# Patient Record
Sex: Female | Born: 1960 | Race: White | Hispanic: No | Marital: Married | Smoking: Never smoker
Health system: Southern US, Community
[De-identification: ages and names within clinical notes are randomized; demographics above are authoritative.]

## PROBLEM LIST (undated history)

## (undated) DIAGNOSIS — E039 Hypothyroidism, unspecified: Secondary | ICD-10-CM

---

## 2008-11-02 ENCOUNTER — Other Ambulatory Visit: Admission: RE | Admit: 2008-11-02 | Discharge: 2008-11-02 | Payer: Self-pay | Admitting: Family Medicine

## 2009-05-12 ENCOUNTER — Encounter: Admission: RE | Admit: 2009-05-12 | Discharge: 2009-05-12 | Payer: Self-pay | Admitting: Family Medicine

## 2009-12-17 ENCOUNTER — Other Ambulatory Visit: Admission: RE | Admit: 2009-12-17 | Discharge: 2009-12-17 | Payer: Self-pay | Admitting: Family Medicine

## 2010-04-14 ENCOUNTER — Other Ambulatory Visit: Payer: Self-pay | Admitting: Family Medicine

## 2010-04-14 DIAGNOSIS — Z1231 Encounter for screening mammogram for malignant neoplasm of breast: Secondary | ICD-10-CM

## 2010-05-16 ENCOUNTER — Ambulatory Visit: Payer: Self-pay

## 2010-05-24 ENCOUNTER — Ambulatory Visit
Admission: RE | Admit: 2010-05-24 | Discharge: 2010-05-24 | Disposition: A | Discharge status: Home / Self Care | Payer: 59 | Source: Ambulatory Visit | Attending: Family Medicine | Admitting: Family Medicine

## 2010-05-24 DIAGNOSIS — Z1231 Encounter for screening mammogram for malignant neoplasm of breast: Secondary | ICD-10-CM

## 2011-04-21 ENCOUNTER — Other Ambulatory Visit: Payer: Self-pay | Admitting: Family Medicine

## 2011-04-21 DIAGNOSIS — Z1231 Encounter for screening mammogram for malignant neoplasm of breast: Secondary | ICD-10-CM

## 2011-05-30 ENCOUNTER — Ambulatory Visit
Admission: RE | Admit: 2011-05-30 | Discharge: 2011-05-30 | Disposition: A | Discharge status: Home / Self Care | Payer: 59 | Source: Ambulatory Visit | Attending: Family Medicine | Admitting: Family Medicine

## 2011-05-30 DIAGNOSIS — Z1231 Encounter for screening mammogram for malignant neoplasm of breast: Secondary | ICD-10-CM

## 2012-04-29 ENCOUNTER — Other Ambulatory Visit: Payer: Self-pay | Admitting: Family Medicine

## 2012-04-29 DIAGNOSIS — Z1231 Encounter for screening mammogram for malignant neoplasm of breast: Secondary | ICD-10-CM

## 2012-05-30 ENCOUNTER — Ambulatory Visit
Admission: RE | Admit: 2012-05-30 | Discharge: 2012-05-30 | Disposition: A | Discharge status: Home / Self Care | Payer: 59 | Source: Ambulatory Visit | Attending: Family Medicine | Admitting: Family Medicine

## 2012-05-30 DIAGNOSIS — Z1231 Encounter for screening mammogram for malignant neoplasm of breast: Secondary | ICD-10-CM

## 2013-05-19 ENCOUNTER — Other Ambulatory Visit: Payer: Self-pay

## 2013-05-19 DIAGNOSIS — Z1231 Encounter for screening mammogram for malignant neoplasm of breast: Secondary | ICD-10-CM

## 2013-06-04 ENCOUNTER — Ambulatory Visit
Admission: RE | Admit: 2013-06-04 | Discharge: 2013-06-04 | Disposition: A | Discharge status: Home / Self Care | Payer: 59 | Source: Ambulatory Visit

## 2013-06-04 DIAGNOSIS — Z1231 Encounter for screening mammogram for malignant neoplasm of breast: Secondary | ICD-10-CM

## 2020-08-31 ENCOUNTER — Emergency Department (HOSPITAL_BASED_OUTPATIENT_CLINIC_OR_DEPARTMENT_OTHER): Payer: BLUE CROSS/BLUE SHIELD

## 2020-08-31 ENCOUNTER — Emergency Department: Payer: BLUE CROSS/BLUE SHIELD

## 2020-08-31 ENCOUNTER — Emergency Department (HOSPITAL_BASED_OUTPATIENT_CLINIC_OR_DEPARTMENT_OTHER)
Admission: EM | Admit: 2020-08-31 | Discharge: 2020-09-01 | Disposition: A | Discharge status: Home / Self Care | Payer: BLUE CROSS/BLUE SHIELD | Attending: Emergency Medicine | Admitting: Emergency Medicine

## 2020-08-31 ENCOUNTER — Encounter (HOSPITAL_BASED_OUTPATIENT_CLINIC_OR_DEPARTMENT_OTHER): Payer: Self-pay

## 2020-08-31 ENCOUNTER — Other Ambulatory Visit: Payer: Self-pay

## 2020-08-31 DIAGNOSIS — S3991XA Unspecified injury of abdomen, initial encounter: Secondary | ICD-10-CM | POA: Diagnosis not present

## 2020-08-31 DIAGNOSIS — R0789 Other chest pain: Secondary | ICD-10-CM | POA: Diagnosis not present

## 2020-08-31 DIAGNOSIS — S22050A Wedge compression fracture of T5-T6 vertebra, initial encounter for closed fracture: Secondary | ICD-10-CM | POA: Diagnosis not present

## 2020-08-31 DIAGNOSIS — E039 Hypothyroidism, unspecified: Secondary | ICD-10-CM | POA: Diagnosis not present

## 2020-08-31 DIAGNOSIS — W19XXXA Unspecified fall, initial encounter: Secondary | ICD-10-CM

## 2020-08-31 DIAGNOSIS — W11XXXA Fall on and from ladder, initial encounter: Secondary | ICD-10-CM | POA: Diagnosis not present

## 2020-08-31 DIAGNOSIS — S299XXA Unspecified injury of thorax, initial encounter: Secondary | ICD-10-CM | POA: Diagnosis present

## 2020-08-31 HISTORY — DX: Hypothyroidism, unspecified: E03.9

## 2020-08-31 LAB — CBC WITH DIFFERENTIAL/PLATELET
Abs Immature Granulocytes: 0.07 10*3/uL (ref 0.00–0.07)
Basophils Absolute: 0 10*3/uL (ref 0.0–0.1)
Basophils Relative: 1 %
Eosinophils Absolute: 0.1 10*3/uL (ref 0.0–0.5)
Eosinophils Relative: 2 %
HCT: 41.2 % (ref 36.0–46.0)
Hemoglobin: 13.7 g/dL (ref 12.0–15.0)
Immature Granulocytes: 1 %
Lymphocytes Relative: 31 %
Lymphs Abs: 2.5 10*3/uL (ref 0.7–4.0)
MCH: 30.7 pg (ref 26.0–34.0)
MCHC: 33.3 g/dL (ref 30.0–36.0)
MCV: 92.4 fL (ref 80.0–100.0)
Monocytes Absolute: 0.6 10*3/uL (ref 0.1–1.0)
Monocytes Relative: 7 %
Neutro Abs: 4.8 10*3/uL (ref 1.7–7.7)
Neutrophils Relative %: 58 %
Platelets: 209 10*3/uL (ref 150–400)
RBC: 4.46 MIL/uL (ref 3.87–5.11)
RDW: 14.1 % (ref 11.5–15.5)
WBC: 8.1 10*3/uL (ref 4.0–10.5)
nRBC: 0 % (ref 0.0–0.2)

## 2020-08-31 LAB — COMPREHENSIVE METABOLIC PANEL
ALT: 18 U/L (ref 0–44)
AST: 24 U/L (ref 15–41)
Albumin: 4.3 g/dL (ref 3.5–5.0)
Alkaline Phosphatase: 73 U/L (ref 38–126)
Anion gap: 9 (ref 5–15)
BUN: 16 mg/dL (ref 6–20)
CO2: 29 mmol/L (ref 22–32)
Calcium: 9 mg/dL (ref 8.9–10.3)
Chloride: 102 mmol/L (ref 98–111)
Creatinine, Ser: 0.78 mg/dL (ref 0.44–1.00)
GFR, Estimated: >60 mL/min (ref >60)
Glucose, Bld: 111 mg/dL — ABNORMAL HIGH (ref 70–99)
Potassium: 3.5 mmol/L (ref 3.5–5.1)
Sodium: 140 mmol/L (ref 135–145)
Total Bilirubin: 0.5 mg/dL (ref 0.3–1.2)
Total Protein: 7.3 g/dL (ref 6.5–8.1)

## 2020-08-31 MED ORDER — FENTANYL CITRATE (PF) 100 MCG/2ML IJ SOLN
50.0000 ug | INTRAMUSCULAR | Status: DC | PRN
  Administered 2020-08-31: 50 ug via INTRAVENOUS
  Filled 2020-08-31: qty 2

## 2020-08-31 MED ORDER — IOHEXOL 300 MG/ML  SOLN
100.0000 mL | Freq: Once | INTRAMUSCULAR | Status: AC | PRN
  Administered 2020-08-31: 100 mL via INTRAVENOUS

## 2020-08-31 MED ORDER — FENTANYL CITRATE (PF) 100 MCG/2ML IJ SOLN
50.0000 ug | Freq: Once | INTRAMUSCULAR | Status: AC
  Administered 2020-08-31: 50 ug via INTRAVENOUS
  Filled 2020-08-31: qty 2

## 2020-08-31 MED ORDER — ONDANSETRON HCL 4 MG/2ML IJ SOLN
4.0000 mg | Freq: Once | INTRAMUSCULAR | Status: AC
  Administered 2020-08-31: 4 mg via INTRAVENOUS
  Filled 2020-08-31: qty 2

## 2020-08-31 NOTE — ED Notes (Signed)
Pt ambulatory to restroom. Declines w/c

## 2020-08-31 NOTE — ED Notes (Signed)
[facility] notified for TLSO brace; answering service to page rep for call back.

## 2020-08-31 NOTE — ED Notes (Signed)
Pt declines pain meds at this time stating she is feeling more comfortable and prefers to wait

## 2020-08-31 NOTE — ED Notes (Signed)
Pt returned from CT.  States she still wold like to "hold off" on pain med administration at this time

## 2020-08-31 NOTE — ED Provider Notes (Signed)
[facility] EMERGENCY DEPARTMENT Provider Note   CSN: [id number] Arrival date & time: 08/31/20  2013     History Chief Complaint  Patient presents with   Meagan Moreno is a 60 y.o. female.  Meagan Moreno is a 60 y.o. female with a history of hypothyroidism, otherwise healthy, who presents to the ED for evaluation after fall.  She reports she was up about 4 rungs on a ladder and wearing a backpack sprayer when the ladder started to fall and she fell backwards landing on her back on the backpack sprayer.  Since the fall she has had severe pain in her mid to low back.  Pain worse with any movement or taking a deep breath.  No head injury and no neck pain.  She denies any numbness weakness or tingling just severe pain in the back.  No anterior chest pain or abdominal pain.  No loss of bowel or bladder control or saddle anesthesia.  No pain medication prior to arrival, did receive a dose of medication in triage with some improvement.  The history is provided by the patient and the spouse.      Past Medical History:  Diagnosis Date   Hypothyroid     There are no problems to display for this patient.   History reviewed. No pertinent surgical history.   OB History   No obstetric history on file.     No family history on file.  Social History   Tobacco Use   Smoking status: Never   Smokeless tobacco: Never  Vaping Use   Vaping Use: Never used  Substance Use Topics   Alcohol use: Yes   Drug use: Never    Home Medications Prior to Admission medications   Medication Sig Start Date End Date Taking? Authorizing Provider  HYDROcodone-acetaminophen (NORCO) 5-325 MG tablet Take 1 tablet by mouth every 6 (six) hours as needed for severe pain. 09/01/20  Yes Dartha Lodge, PA-C    Allergies    Amoxicillin-pot clavulanate  Review of Systems   Review of Systems  Constitutional:  Negative for chills and fever.  HENT: Negative.    Respiratory:  Negative  for cough and shortness of breath.   Cardiovascular:  Negative for chest pain.  Gastrointestinal:  Negative for abdominal pain.  Musculoskeletal:  Positive for back pain. Negative for neck pain.  Skin:  Negative for color change, rash and wound.  Neurological:  Negative for weakness, numbness and headaches.  All other systems reviewed and are negative.  Physical Exam Updated Vital Signs BP (!) 148/90 (BP Location: Right Arm)   Pulse 72   Temp 98.3 F (36.8 C) (Oral)   Resp (!) 28   Ht 5\' 8"  (1.727 m)   Wt 64.4 kg   SpO2 96%   BMI 21.59 kg/m   Physical Exam Vitals and nursing note reviewed.  Constitutional:      General: She is not in acute distress.    Appearance: Normal appearance. She is well-developed. She is not diaphoretic.     Comments: Patient appears very uncomfortable but is in no acute distress  HENT:     Head: Normocephalic and atraumatic.     Comments: No hematoma, step-off or deformity, negative battle sign Eyes:     General:        Right eye: No discharge.        Left eye: No discharge.     Pupils: Pupils are equal, round, and reactive  to light.  Neck:     Comments: No midline C-spine tenderness, normal range of motion Cardiovascular:     Rate and Rhythm: Normal rate and regular rhythm.     Pulses: Normal pulses.     Heart sounds: Normal heart sounds.  Pulmonary:     Effort: Pulmonary effort is normal. No respiratory distress.     Breath sounds: Normal breath sounds. No wheezing or rales.     Comments: Respirations equal and unlabored, patient able to speak in full sentences, lungs clear to auscultation bilaterally, there is no tenderness over the anterior chest wall but patient with tenderness over posterior ribs bilaterally without palpable crepitus or deformity, no ecchymosis Chest:     Chest wall: Tenderness present.  Abdominal:     General: Bowel sounds are normal. There is no distension.     Palpations: Abdomen is soft. There is no mass.      Tenderness: There is no abdominal tenderness. There is no guarding.     Comments: Abdomen soft, nondistended, nontender to palpation in all quadrants without guarding or peritoneal signs there is some flank tenderness bilaterally  Musculoskeletal:        General: Tenderness present.     Cervical back: Neck supple.     Comments: Tenderness starting in the mid thoracic spine and extending down through the lumbar spine at midline with some overlying erythema and swelling, no ecchymosis and no palpable step-off.  Patient also has tenderness to the paraspinal muscles bilaterally. No focal pain over her extremities, or joints supple and easily movable, all compartments soft.  Skin:    General: Skin is warm and dry.     Capillary Refill: Capillary refill takes less than 2 seconds.  Neurological:     Mental Status: She is alert and oriented to person, place, and time.     Coordination: Coordination normal.     Comments: Speech is clear, able to follow commands CN III-XII intact Normal strength in upper and lower extremities bilaterally including dorsiflexion and plantar flexion, strong and equal grip strength Sensation normal to light and sharp touch Moves extremities without ataxia, coordination intact  Psychiatric:        Mood and Affect: Mood normal.        Behavior: Behavior normal.    ED Results / Procedures / Treatments   Labs (all labs ordered are listed, but only abnormal results are displayed) Labs Reviewed  COMPREHENSIVE METABOLIC PANEL - Abnormal; Notable for the following components:      Result Value   Glucose, Bld 111 (*)    All other components within normal limits  CBC WITH DIFFERENTIAL/PLATELET    EKG None  Radiology CT CHEST ABDOMEN PELVIS W CONTRAST  Result Date: 08/31/2020 CLINICAL DATA:  Fall from height, chest and abdominal trauma, back pain EXAM: CT CHEST, ABDOMEN, AND PELVIS WITH CONTRAST TECHNIQUE: Multidetector CT imaging of the chest, abdomen and pelvis was  performed following the standard protocol during bolus administration of intravenous contrast. CONTRAST:  100mL OMNIPAQUE IOHEXOL 300 MG/ML  SOLN COMPARISON:  None. FINDINGS: CT CHEST FINDINGS Cardiovascular: No significant vascular findings. Normal heart size. No pericardial effusion. Mediastinum/Nodes: No enlarged mediastinal, hilar, or axillary lymph nodes. Thyroid gland, trachea, and esophagus demonstrate no significant findings. Lungs/Pleura: Lungs are clear. No pleural effusion or pneumothorax. Musculoskeletal: There is a anterior wedge compression fracture of T6 with fracture of the superior endplate and approximately 10-20% loss of height. No retropulsion. No listhesis. Negligible surrounding paraspinal soft tissue swelling. CT  ABDOMEN PELVIS FINDINGS Hepatobiliary: Scattered cysts within the left hepatic lobe. Liver otherwise unremarkable. Gallbladder unremarkable. Pancreas: Unremarkable Spleen: Unremarkable Adrenals/Urinary Tract: Adrenal glands are unremarkable. Kidneys are normal, without renal calculi, focal lesion, or hydronephrosis. Bladder is unremarkable. Stomach/Bowel: Stomach is within normal limits. Appendix appears normal. No evidence of bowel wall thickening, distention, or inflammatory changes. No free intraperitoneal gas or fluid. Vascular/Lymphatic: Mild aortoiliac atherosclerotic calcification. No aortic aneurysm. No pathologic adenopathy within the abdomen and pelvis. Reproductive: Lobular contour of the uterus may relate to underlying uterine fibroids. The pelvic organs are otherwise unremarkable. Other: No abdominal wall hernia. Musculoskeletal: Degenerative changes are seen within the lumbar spine. No acute bone abnormality within the abdomen and pelvis. IMPRESSION: Acute mild anterior wedge compression fracture of T6 with fracture of the superior endplate and resultant 10-20% loss of height. No retropulsion or listhesis. No significant surrounding soft tissue swelling. Fibroid uterus.  Aortic Atherosclerosis (ICD10-I70.0). Electronically Signed   By: Helyn Numbers MD   On: 08/31/2020 22:31   CT T-SPINE NO CHARGE  Result Date: 09/01/2020 CLINICAL DATA:  Fall from ladder EXAM: CT Thoracic and Lumbar spine without contrast TECHNIQUE: Multiplanar CT images of the thoracic and lumbar spine were reconstructed from contemporary CT of the Chest, Abdomen, and Pelvis CONTRAST:  No additional contrast COMPARISON:  None FINDINGS: CT THORACIC SPINE FINDINGS Alignment: Normal. Vertebrae: There is a compression fracture of T6 with less than 25% height loss. Fracture involves the superior endplate and anterior wall. Paraspinal and other soft tissues: Negative. Disc levels: No spinal canal stenosis. 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 Alignment: Grade 1 anterolisthesis at L3-4. Vertebrae: No acute fracture. Paraspinal and other soft tissues: Negative. Disc levels: Mild left L4 foraminal stenosis. IMPRESSION: 1. T6 wedge compression fracture with less than 25% height loss. No retropulsion or spinal canal stenosis. 2. No acute fracture of the lumbar spine. 3. 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 Electronically Signed   By: Deatra Robinson M.D.   On: 09/01/2020 00:33   CT L-SPINE NO CHARGE  Result Date: 09/01/2020 CLINICAL DATA:  Fall from ladder EXAM: CT Thoracic and Lumbar spine without contrast TECHNIQUE: Multiplanar CT images of the thoracic and lumbar spine were reconstructed from contemporary CT of the Chest, Abdomen, and Pelvis CONTRAST:  No additional contrast COMPARISON:  None FINDINGS: CT THORACIC SPINE FINDINGS Alignment: Normal. Vertebrae: There is a compression fracture of T6 with less than 25% height loss. Fracture involves the superior endplate and anterior wall. Paraspinal and other soft tissues: Negative. Disc levels: No spinal canal stenosis. CT LUMBAR SPINE FINDINGS Segmentation: Transitional  lumbosacral anatomy with partial sacralization of L5. Alignment: Grade 1 anterolisthesis at L3-4. Vertebrae: No acute fracture. Paraspinal and other soft tissues: Negative. Disc levels: Mild left L4 foraminal stenosis. IMPRESSION: 1. T6 wedge compression fracture with less than 25% height loss. No retropulsion or spinal canal stenosis. 2. No acute fracture of the lumbar spine. 3. 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 Electronically Signed   By: Deatra Robinson M.D.   On: 09/01/2020 00:33     Procedures Procedures   Medications Ordered in ED Medications  ondansetron [facility]) injection 4 mg (4 mg Intravenous Given 08/31/20 2035)  fentaNYL (SUBLIMAZE) injection 50 mcg (50 mcg Intravenous Given 08/31/20 2231)  iohexol (OMNIPAQUE) 300 MG/ML solution 100 mL (100 mLs Intravenous Contrast Given 08/31/20 2203)  HYDROcodone-acetaminophen (NORCO/VICODIN) 5-325 MG per tablet 1 tablet (1 tablet Oral Given 09/01/20 0045)  ED Course  I have reviewed the triage vital signs and the nursing notes.  Pertinent labs & imaging results that were available during my care of the patient were reviewed by me and considered in my medical decision making (see chart for details).   MDM Rules/Calculators/A&P                         60 year old female who presents after fall from.  Complaining of severe back pain, very uncomfortable on arrival, but neurologically intact, no head or neck injury.  She has midline thoracic and lumbar spine tenderness, but also has tenderness over the posterior ribs and flanks.  We will get labs and CT chest abdomen pelvis with recons of the thoracic and lumbar spine to evaluate for potential vertebral fracture, rib fracture or retroperitoneal bleed or injury.  Additional pain medication given.  I have independently ordered, reviewed and interpreted all labs and imaging: CBC, no leukocytosis, normal hemoglobin CMP: Glucose 111, no other electrolyte derangements,  normal renal liver function  CT chest abdomen pelvis with acute mild anterior wedge compression fracture of T6 with less than 25% height loss, spinal recon images showed no evidence of spinal stenosis or retropulsion associated with this fracture.  No other vertebral fractures noted and fortunately no rib fractures, retroperitoneal injury or other acute findings noted.  Discussed with NP Hildred Priest with neurosurgery agrees with plan for TLSO and outpatient follow-up.  TLSO rep contacted to put patient in brace for pain control, especially given patient's plans for travel and 9-hour car ride in the next few days.  After after receiving brace patient reports significant improvement in pain.  Discussed appropriate pain management at home and prescribed short course of Norco as needed for severe pain.  We will have patient follow-up as an outpatient with neurosurgery.  Return precautions discussed.  Discharged home in good condition.  Final Clinical Impression(s) / ED Diagnoses Final diagnoses:  Fall  Closed wedge compression fracture of T6 vertebra, initial encounter [facility])    Rx / DC Orders ED Discharge Orders          Ordered    HYDROcodone-acetaminophen (NORCO) 5-325 MG tablet  Every 6 hours PRN        09/01/20 0021             Dartha Lodge, PA-C 09/06/20 1131    Tegeler, Canary Brim, MD 09/10/20 1104

## 2020-08-31 NOTE — ED Triage Notes (Signed)
Pt states she fell about 4 rungs off a ladder with a backpack sprayer on. Pt states she landed on her back and now had severe middle back pain. Pt has been ambulatory since fall. Distal CMS intact during triage.

## 2020-09-01 MED ORDER — HYDROCODONE-ACETAMINOPHEN 5-325 MG PO TABS
1.0000 | ORAL_TABLET | Freq: Once | ORAL | Status: AC
  Administered 2020-09-01: 1 via ORAL
  Filled 2020-09-01: qty 1

## 2020-09-01 MED ORDER — HYDROCODONE-ACETAMINOPHEN 5-325 MG PO TABS: 1.0000 | ORAL_TABLET | Freq: Four times a day (QID) | ORAL | 0 refills | Status: AC | PRN

## 2020-09-01 NOTE — ED Notes (Signed)
[facility] ED Ortho Tech here to place TLSO

## 2020-09-01 NOTE — ED Notes (Signed)
Repeat call to [facility] to follow up on TLSO brace. Message given to call service to notify rep.

## 2020-09-01 NOTE — Discharge Instructions (Addendum)
For pain use ibuprofen 600 mg and Tylenol 650 mg every 6 hours.  You can use prescribed hydrocodone as needed every 6 hours for severe breakthrough pain.  This medication can cause some drowsiness, use with caution as this can be addicting.  You can also use topical lidocaine patches, ice and heat to help with pain.  Wear TLSO brace when out of bed, can remove to shower.  You can follow-up with Dr. Dutch Quint with [place] [facility] or contact your PCP for a [facility] neurosurgery and spine referral.  Return for any new or worsening symptoms such as significantly worsened pain, numbness, weakness, loss of bowel or bladder control.

## 2022-02-12 IMAGING — CT CT L SPINE W/O CM
3 of 4 series · 14 of 33 positions shown, 16 images · IV contrast (Omnipaque)
Comparison: None

CLINICAL DATA: Fall from ladder

EXAM:
CT Thoracic and Lumbar spine without contrast
TECHNIQUE: Multiplanar CT images of the thoracic and lumbar spine were
reconstructed from contemporary CT of the Chest, Abdomen, and Pelvis
CONTRAST:  No additional contrast

[Series 11: l spine st · axial · 0.31mm/px · z∈[+864,+1040]mm · 6 of 115 slices shown, 8 images]
[im 18/115  soft-tissue]
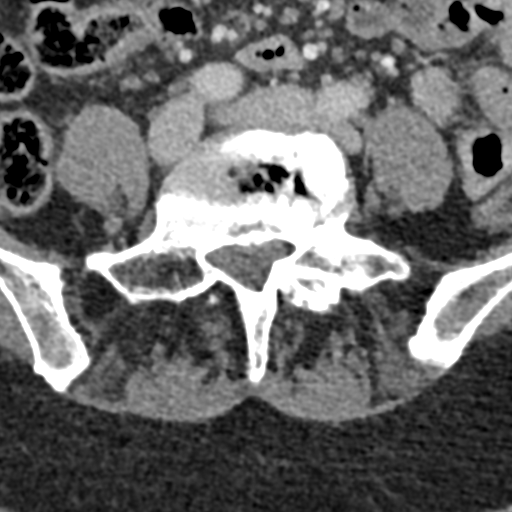
[im 18/115  bone]
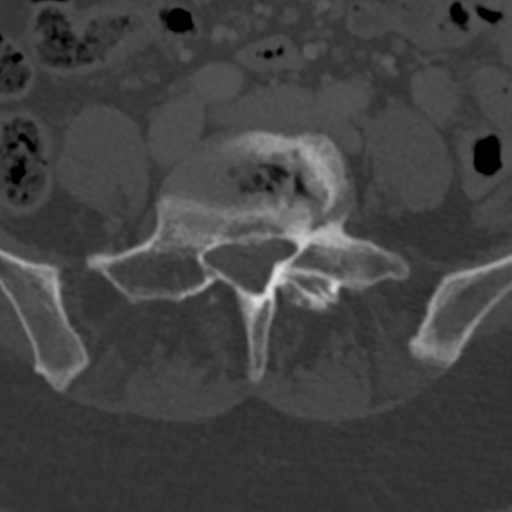
[im 36/115  bone]
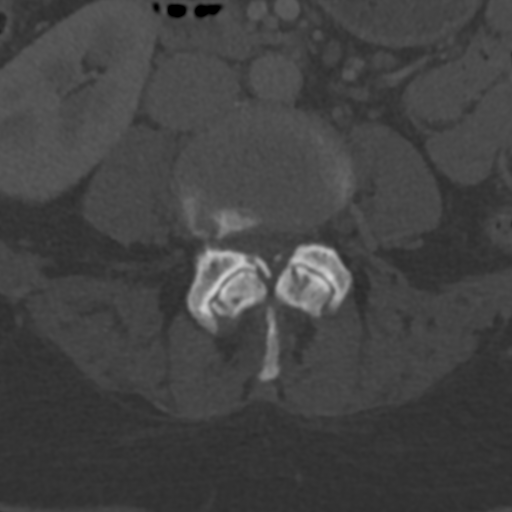
[im 53/115  bone]
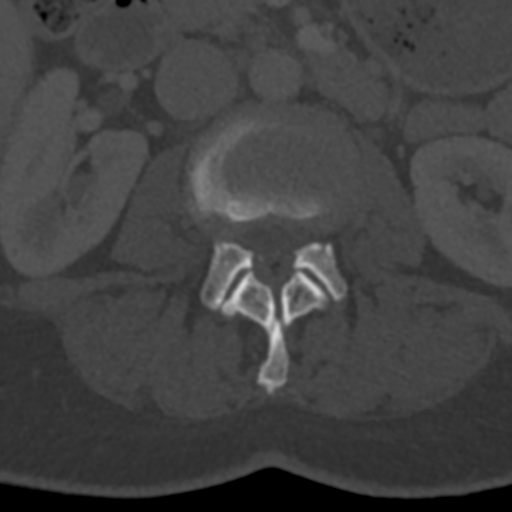
[im 71/115  bone]
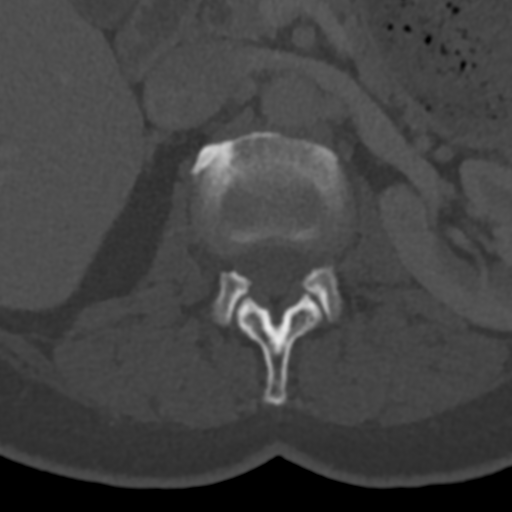
[im 88/115  soft-tissue]
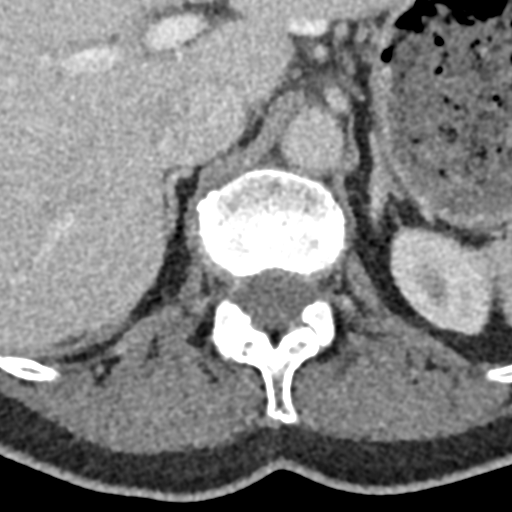
[im 88/115  bone]
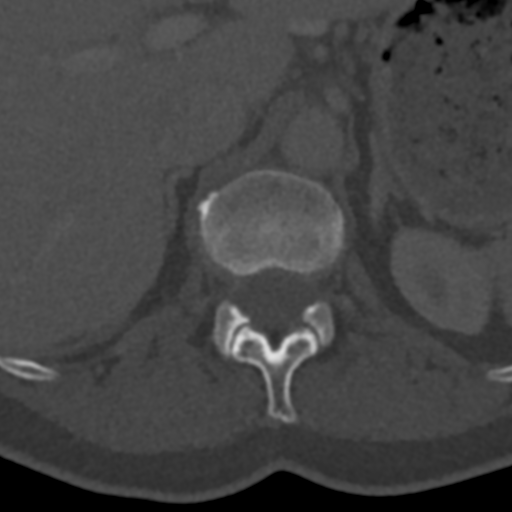
[im 106/115  bone]
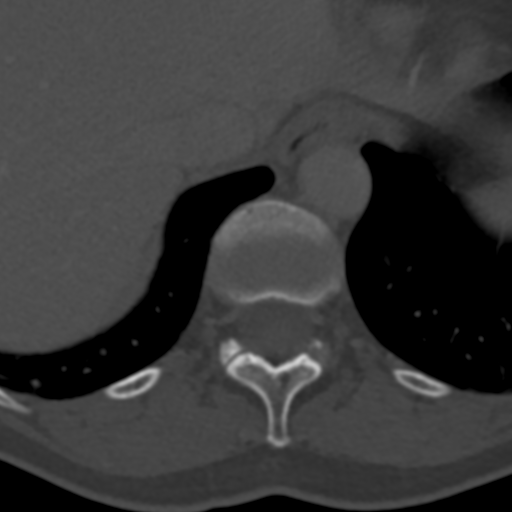

[Series 12: l spine cor bone · coronal · 0.36mm/px · 3 of 82 slices shown]
[im 17/82  bone]
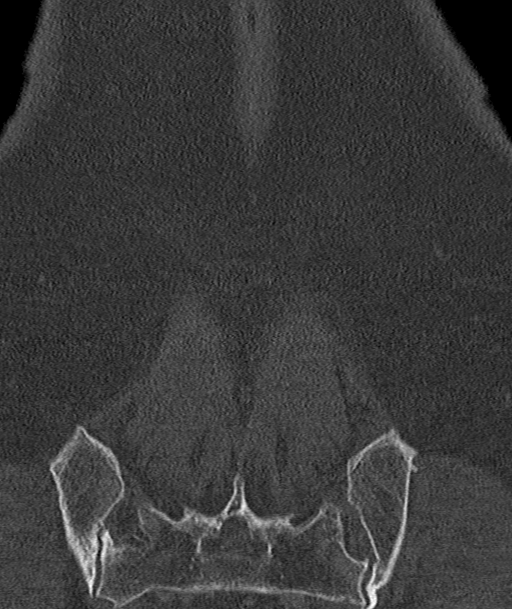
[im 33/82  bone]
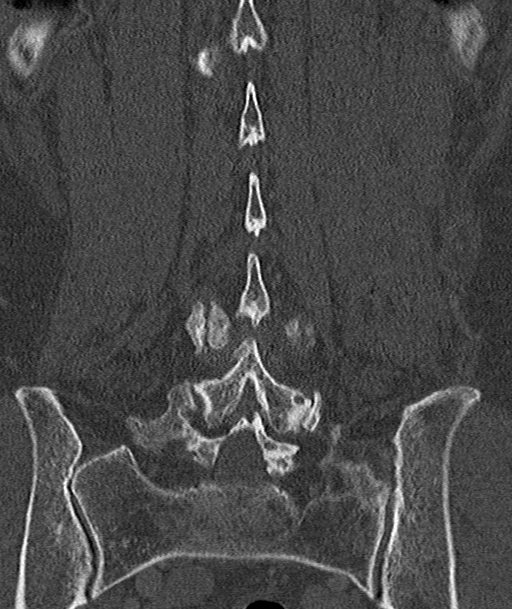
[im 49/82  bone]
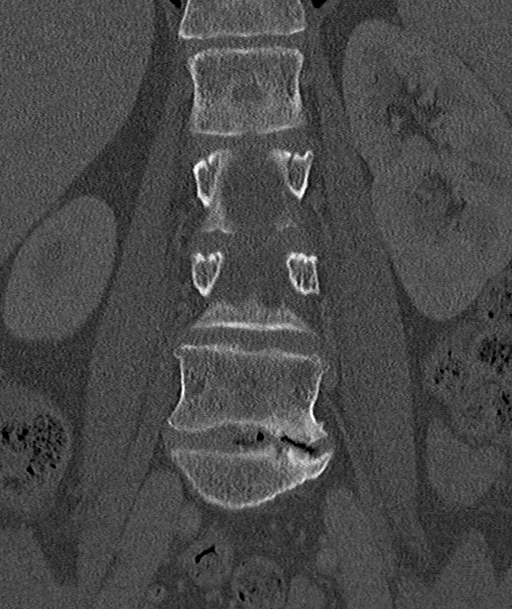

[Series 14: l spine ax disc · sagittal · 0.30mm/px · 5 of 64 slices shown]
[im 11/64  bone]
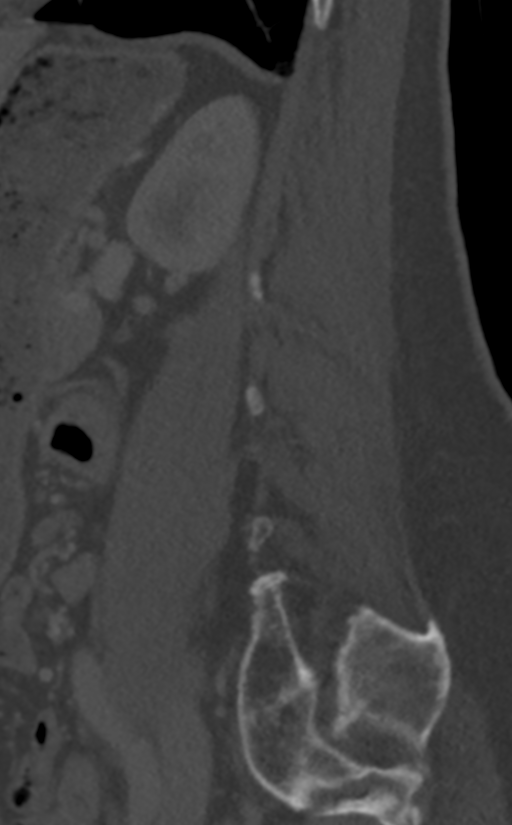
[im 22/64  bone]
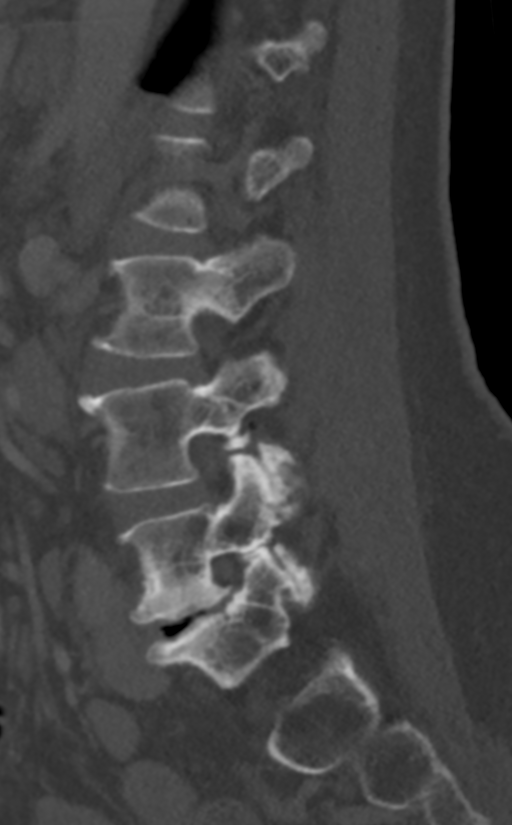
[im 32/64  bone]
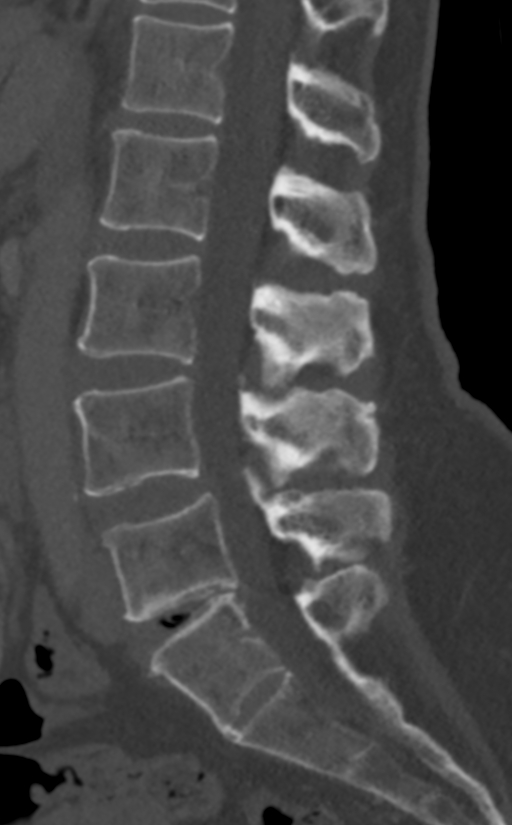
[im 43/64  bone]
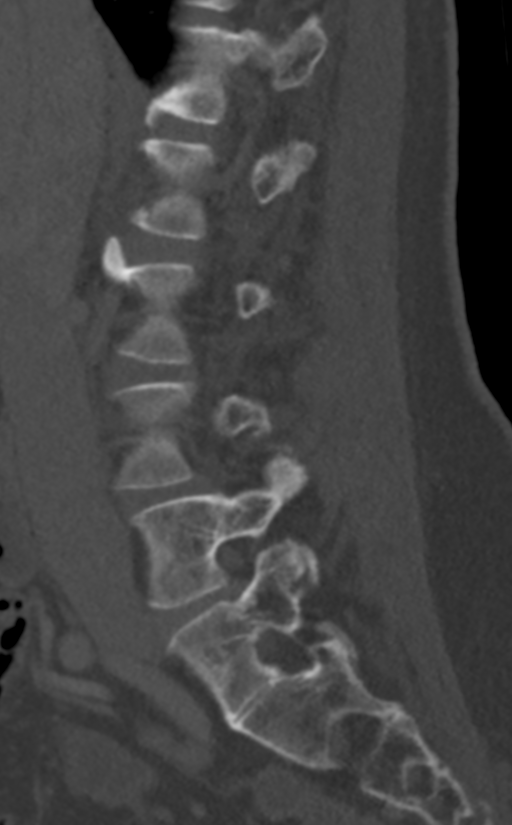
[im 53/64  bone]
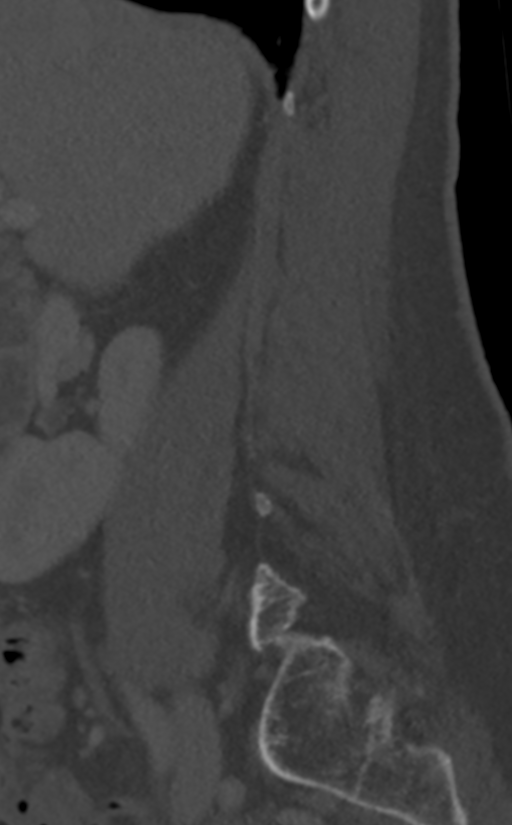

[14 of 33 positions shown; findings below may reference images not displayed]

FINDINGS: CT THORACIC SPINE FINDINGS

Alignment: Normal.

Vertebrae: There is a compression fracture of T6 with less than 25%
height loss. Fracture involves the superior endplate and anterior
wall.

Paraspinal and other soft tissues: Negative.

Disc levels: No spinal canal stenosis.

CT LUMBAR SPINE FINDINGS

Segmentation: Transitional lumbosacral anatomy with partial
sacralization of L5.

Alignment: Grade 1 anterolisthesis at L3-4.

Vertebrae: No acute fracture.

Paraspinal and other soft tissues: Negative.

Disc levels: Mild left L4 foraminal stenosis.
IMPRESSION: 1. T6 wedge compression fracture with less than 25% height loss. No
retropulsion or spinal canal stenosis.
2. No acute fracture of the lumbar spine.
3. Transitional lumbosacral anatomy with partial sacralization of
L5.
